# Patient Record
Sex: Female | Born: 1985 | Hispanic: No | Marital: Married | Smoking: Never smoker
Health system: Southern US, Community
[De-identification: ages and names within clinical notes are randomized; demographics above are authoritative.]

## PROBLEM LIST (undated history)

## (undated) DIAGNOSIS — O99280 Endocrine, nutritional and metabolic diseases complicating pregnancy, unspecified trimester: Secondary | ICD-10-CM

## (undated) DIAGNOSIS — R519 Headache, unspecified: Secondary | ICD-10-CM

## (undated) DIAGNOSIS — E079 Disorder of thyroid, unspecified: Secondary | ICD-10-CM

## (undated) DIAGNOSIS — E119 Type 2 diabetes mellitus without complications: Secondary | ICD-10-CM

## (undated) DIAGNOSIS — R51 Headache: Secondary | ICD-10-CM

## (undated) DIAGNOSIS — A048 Other specified bacterial intestinal infections: Secondary | ICD-10-CM

## (undated) DIAGNOSIS — Z8632 Personal history of gestational diabetes: Secondary | ICD-10-CM

## (undated) HISTORY — DX: Endocrine, nutritional and metabolic diseases complicating pregnancy, unspecified trimester: O99.280

## (undated) HISTORY — DX: Other specified bacterial intestinal infections: A04.8

## (undated) HISTORY — DX: Personal history of gestational diabetes: Z86.32

## (undated) HISTORY — DX: Disorder of thyroid, unspecified: E07.9

---

## 2016-03-22 NOTE — L&D Delivery Note (Addendum)
Patient is 31 y.o. W2N5621 [redacted]w[redacted]d admitted for SROM and active labor. Prenatal course also complicated by thyroid disease.  Delivery Note At 5:39 PM a viable female was delivered via  (Presentation: LOA ).  APGAR: 8 ,9 ; weight pending  .   Placenta status: intact, spontaneous  Cord: 3 vessel with the following complications: none  Anesthesia:  epidural Episiotomy:  none Lacerations:  none Est. Blood Loss (mL):  250  Mom to postpartum.  Baby to Couplet care / Skin to Skin.    Upon arrival, patient was complete. She pushed with good maternal effort to deliver a viable female infant in cephalic, LOA position over intact perineum. No nuchal cord present.Anterior shoulder delivered easily. Baby was noted to have good tone and place on maternal abdomen for oral suctioning, drying and stimulation. Delayed cord clamping performed. Placenta delivered spontaneously with gentle cord traction. Fundus firm with massage and Pitocin. Perineum inspected and found to have no lacerations. Counts of sharps, instruments, and lap pads were all correct.   Rolm Bookbinder, DO [place] Fellow

## 2016-05-31 LAB — OB RESULTS CONSOLE HEPATITIS B SURFACE ANTIGEN: HEP B S AG: NEGATIVE

## 2016-05-31 LAB — TSH
GLUCOSE 1 HOUR: 114
Hemoglobin A1C: 5.1
TSH: 0.4

## 2016-05-31 LAB — OB RESULTS CONSOLE HGB/HCT, BLOOD
HCT: 41
HEMOGLOBIN: 13.4

## 2016-05-31 LAB — OB RESULTS CONSOLE ANTIBODY SCREEN: Antibody Screen: NEGATIVE

## 2016-05-31 LAB — OB RESULTS CONSOLE RUBELLA ANTIBODY, IGM: Rubella: IMMUNE

## 2016-05-31 LAB — OB RESULTS CONSOLE ABO/RH: RH TYPE: POSITIVE

## 2016-05-31 LAB — OB RESULTS CONSOLE PLATELET COUNT: Platelets: 195

## 2016-05-31 LAB — OB RESULTS CONSOLE RPR: RPR: NONREACTIVE

## 2016-11-17 ENCOUNTER — Encounter: Payer: Self-pay | Admitting: *Deleted

## 2016-11-17 ENCOUNTER — Ambulatory Visit (INDEPENDENT_AMBULATORY_CARE_PROVIDER_SITE_OTHER): Payer: Medicaid Other | Admitting: Certified Nurse Midwife

## 2016-11-17 ENCOUNTER — Encounter: Payer: Self-pay | Admitting: Certified Nurse Midwife

## 2016-11-17 DIAGNOSIS — O09899 Supervision of other high risk pregnancies, unspecified trimester: Secondary | ICD-10-CM

## 2016-11-17 DIAGNOSIS — K279 Peptic ulcer, site unspecified, unspecified as acute or chronic, without hemorrhage or perforation: Secondary | ICD-10-CM

## 2016-11-17 DIAGNOSIS — Z8632 Personal history of gestational diabetes: Secondary | ICD-10-CM

## 2016-11-17 DIAGNOSIS — Z3480 Encounter for supervision of other normal pregnancy, unspecified trimester: Secondary | ICD-10-CM

## 2016-11-17 DIAGNOSIS — Z3483 Encounter for supervision of other normal pregnancy, third trimester: Secondary | ICD-10-CM | POA: Diagnosis not present

## 2016-11-17 DIAGNOSIS — R760 Raised antibody titer: Secondary | ICD-10-CM

## 2016-11-17 DIAGNOSIS — R768 Other specified abnormal immunological findings in serum: Secondary | ICD-10-CM

## 2016-11-17 DIAGNOSIS — R7689 Other specified abnormal immunological findings in serum: Secondary | ICD-10-CM | POA: Insufficient documentation

## 2016-11-17 LAB — POCT URINALYSIS DIP (DEVICE)
BILIRUBIN URINE: NEGATIVE
GLUCOSE, UA: NEGATIVE mg/dL
Hgb urine dipstick: NEGATIVE
KETONES UR: NEGATIVE mg/dL
Leukocytes, UA: NEGATIVE
Nitrite: NEGATIVE
PROTEIN: NEGATIVE mg/dL
SPECIFIC GRAVITY, URINE: 1.015 (ref 1.005–1.030)
Urobilinogen, UA: 0.2 mg/dL (ref 0.0–1.0)
pH: 7 (ref 5.0–8.0)

## 2016-11-17 MED ORDER — PREPLUS 27-1 MG PO TABS: 1.0000 | ORAL_TABLET | Freq: Every day | ORAL | 6 refills | Status: DC

## 2016-11-17 NOTE — Patient Instructions (Addendum)
Places to have your son circumcised:    [facility] [phone] $480 by 4 wks  [facility] [phone] $244 by 4 wks  [facility] [phone] $175 by 2 wks  [facility] [phone] $250 by 7 days [facility] [phone] $150 by 4 wks  These prices sometimes change but are roughly what you can expect to pay. Please call and confirm pricing.   Circumcision is considered an elective/non-medically necessary procedure. There are many reasons parents decide to have their sons circumsized. During the first year of life circumcised males have a reduced risk of urinary tract infections but after this year the rates between circumcised males and uncircumcised males are the same.  It is safe to have your son circumcised outside of the hospital and the places above perform them regularly.   AREA PEDIATRIC/FAMILY PRACTICE PHYSICIANS  [facility] [address] [place], [place] [address] Phone - [phone]   Fax - [phone]  [facility] [address] [place], [place]  [address] Phone - [phone]   Fax - [phone]  [facility] [address] [place], [place]  [address] Phone - [phone]   Fax - [phone]  [facility] [address] [place], [place]  [address] Phone - [phone]   Fax - [phone]  [facility] [address] [place], [place]  [address] Phone - [phone]   Fax - [phone]  [facility] [address], [address] [place], [place]  [address] Phone - [phone]   Fax - [phone]  [facility] [place] [address], [address] [place], [place]  [address] Phone - [phone]   Fax - [phone]  [facility] [address] [place], [place]  [address] Phone - [phone]   Fax - [phone]  [facility] [address] [place], [place]  [address] Phone - [phone]   Fax - [phone] [facility] [place] [address] [place], [place]  [address] Phone - [phone]   Fax - [phone]  [facility] [address] [place], [place]  [address] Phone - [phone]   Fax - [phone]  [facility] [address], [address] [place], [place]  [address] Phone - [phone]   Fax - [phone]  [facility] [place] [address], [address] [place], [place]  [address] Phone - [phone]   Fax - [phone]  [facility] [address], [address] [place], [place]  [address] Phone - [phone]  [facility] [address] [place], [place]  [address] Phone - [phone]   Fax - [phone]  [facility] [address], [address] [place], [place]  [address] Phone - [phone]   Fax - [phone]  [facility] [address] [place], [place]  [address] Phone - [phone]   Fax - [phone]  [facility] [address] [place], [place]  [address] Phone - [phone]   Fax - [phone]   [facility] [address], [address] [place], [place]  [address] Phone - [phone]   Fax - [phone]  [facility] [address], [address] [place], [place]  [address] Phone - [phone]   Fax - [phone]  [facility] RUBIN [address], [address] [place], [place]  [address] Phone - [phone]   Fax - [phone]  [facility] [address] [place], [place]  [address] Phone - [phone]   Fax - [phone]  [place] [facility] [address] [place]  [address] Phone - [phone]   Fax - [phone] [place] [facility] [address] [place], [place]  [address] Phone - [phone]   Fax - [phone]  [place] [facility] [address] [place], [place]  [address] Phone - [phone]   Fax - [phone]  [facility] [place] [address] [place], [place]  [address] Phone - [phone]   Fax - [phone]    

## 2016-11-17 NOTE — Progress Notes (Signed)
Here for first prenatal care. Moved from [place] where had started prenatal care and brought records with her. Given new patient education packets. States a week ago had scant darking vaginal discharge once- had intercourse . None since. Patient requesting prescription for prenatal vitamins without gelatin- preplus ordered and asked her to check with pharmacy to make sure does not have gelatin . Signed up for babyscripps app. Also expressed wants female providers. I explained to her she can ask for female providers when she schedules her appointments , but we can not guarantee that she will always see a female provider either in ob visits or delivery. She signed the consent re: involvement of female provider in pregnancy care. Medicaid home form completed

## 2016-11-17 NOTE — Progress Notes (Signed)
Subjective:  Melody ComasSumyya Hawbaker is a 31 y.o. G3P2002 at 2073w0d being seen today for her initial prenatal visit, [place] and [place] appt was 2 months ago.  She is currently monitored for the following issues for this high-risk pregnancy and has History of gestational diabetes; Supervision of other normal pregnancy, antepartum; Thyroid antibody positive; and Peptic ulcer disease on her problem list.  Patient reports no complaints.  Contractions: Not present. Vag. Bleeding: None, Other.  Movement: Present. Denies leaking of fluid.   The following portions of the patient's history were reviewed and updated as appropriate: allergies, current medications, past family history, past medical history, past social history, past surgical history and problem list. Problem list updated.  Objective:   Vitals:   11/17/16 1010 11/17/16 1012  BP: 98/61   Pulse: 98   Weight: 161 lb 8 oz (73.3 kg)   Height:  5\' 1"  (1.549 m)    Fetal Status: Fetal Heart Rate (bpm): 133 Fundal Height: 34 cm Movement: Present  Presentation: Vertex  General:  Alert, oriented and cooperative. Patient is in no acute distress.  Skin: Skin is warm and dry. No rash noted.   Cardiovascular: Normal heart rate noted  Respiratory: Normal respiratory effort, no problems with respiration noted  Abdomen: Soft, gravid, appropriate for gestational age. Pain/Pressure: Present     Pelvic: Vag. Bleeding: None, Other     Cervical exam deferred        Extremities: Normal range of motion.  Edema: Trace  Mental Status: Normal mood and affect. Normal behavior. Normal judgment and thought content.   Urinalysis: Urine Protein: Negative Urine Glucose: Negative  Assessment and Plan:  Pregnancy: G3P2002 at 7473w0d  1. Supervision of other normal pregnancy, antepartum - HIV antibody (with reflex) - Glucose Tolerance, 1 Hour - RPR - CBC  2. Thyroid antibody positive - TSH - Thyroid antibodies - T3, free - T4, free - US MFM OB COMP  + 14 WK; Future  3. Peptic ulcer disease - +h.pylori, prior to pregnancy, did not take meds d/t BF  4. History of gestational diabetes -last pregnancy, diet controlled - nml early GTT  Preterm labor symptoms and general obstetric precautions including but not limited to vaginal bleeding, contractions, leaking of fluid and fetal movement were reviewed in detail with the patient. Please refer to After Visit Summary for other counseling recommendations.  Return in about 2 weeks (around 12/01/2016).   Donette LarryBhambri, Todrick Siedschlag, CNM

## 2016-11-18 LAB — CBC
HEMOGLOBIN: 12.7 g/dL (ref 11.1–15.9)
Hematocrit: 37.7 % (ref 34.0–46.6)
MCH: 28.9 pg (ref 26.6–33.0)
MCHC: 33.7 g/dL (ref 31.5–35.7)
MCV: 86 fL (ref 79–97)
Platelets: 176 10*3/uL (ref 150–379)
RBC: 4.39 x10E6/uL (ref 3.77–5.28)
RDW: 14 % (ref 12.3–15.4)
WBC: 7.7 10*3/uL (ref 3.4–10.8)

## 2016-11-18 LAB — TSH: TSH: 0.727 u[IU]/mL (ref 0.450–4.500)

## 2016-11-18 LAB — T4, FREE: Free T4: 1.02 ng/dL (ref 0.82–1.77)

## 2016-11-18 LAB — THYROID ANTIBODIES: Thyroperoxidase Ab SerPl-aCnc: 12 IU/mL (ref 0–34)

## 2016-11-18 LAB — RPR: RPR: NONREACTIVE

## 2016-11-18 LAB — T3, FREE: T3, Free: 2.9 pg/mL (ref 2.0–4.4)

## 2016-11-18 LAB — HIV ANTIBODY (ROUTINE TESTING W REFLEX): HIV Screen 4th Generation wRfx: NONREACTIVE

## 2016-11-18 LAB — GLUCOSE TOLERANCE, 1 HOUR: GLUCOSE, 1HR PP: 146 mg/dL (ref 65–199)

## 2016-11-23 ENCOUNTER — Telehealth: Payer: Self-pay | Admitting: General Practice

## 2016-11-23 NOTE — Telephone Encounter (Signed)
-----   Message from Donette LarryMelanie Bhambri, [place] sent at 11/18/2016 11:04 AM EDT ----- Needs 2hr GTT

## 2016-11-23 NOTE — Telephone Encounter (Signed)
Called patient, no answer- left message to call us back concerning results & information regarding an appt

## 2016-11-24 ENCOUNTER — Encounter: Payer: Self-pay | Admitting: Obstetrics and Gynecology

## 2016-11-24 DIAGNOSIS — O99810 Abnormal glucose complicating pregnancy: Secondary | ICD-10-CM | POA: Insufficient documentation

## 2016-11-25 ENCOUNTER — Ambulatory Visit (HOSPITAL_COMMUNITY)
Admission: RE | Admit: 2016-11-25 | Discharge: 2016-11-25 | Disposition: A | Discharge status: Home / Self Care | Payer: Medicaid Other | Source: Ambulatory Visit | Attending: Certified Nurse Midwife | Admitting: Certified Nurse Midwife

## 2016-11-25 ENCOUNTER — Other Ambulatory Visit: Payer: Self-pay | Admitting: Certified Nurse Midwife

## 2016-11-25 ENCOUNTER — Encounter: Payer: Self-pay | Admitting: *Deleted

## 2016-11-25 DIAGNOSIS — O24419 Gestational diabetes mellitus in pregnancy, unspecified control: Secondary | ICD-10-CM | POA: Insufficient documentation

## 2016-11-25 DIAGNOSIS — R760 Raised antibody titer: Secondary | ICD-10-CM | POA: Insufficient documentation

## 2016-11-25 DIAGNOSIS — Z3A35 35 weeks gestation of pregnancy: Secondary | ICD-10-CM | POA: Diagnosis not present

## 2016-11-25 DIAGNOSIS — R768 Other specified abnormal immunological findings in serum: Secondary | ICD-10-CM

## 2016-11-25 DIAGNOSIS — Z3689 Encounter for other specified antenatal screening: Secondary | ICD-10-CM | POA: Insufficient documentation

## 2016-11-25 NOTE — Progress Notes (Signed)
Patient came to front desk window inquiring about her glucose tolerance test results. I informed her that her glucose was elevated and that she needed to return for a 2hr gtt. Patient has an appt on 9/13 and stated she will have the test at that time. I advised that she needs to have nothing to eat or drink after midnight the night before the test and also that she com in earlier than her 11:00 appt in order to get the test started and done by her appt time. Patient voiced understanding of instructions, will come in at 8am to start gtt.

## 2016-12-02 ENCOUNTER — Encounter: Payer: Self-pay | Admitting: *Deleted

## 2016-12-02 ENCOUNTER — Other Ambulatory Visit (HOSPITAL_COMMUNITY)
Admission: RE | Admit: 2016-12-02 | Discharge: 2016-12-02 | Disposition: A | Discharge status: Home / Self Care | Payer: Medicaid Other | Source: Ambulatory Visit | Attending: Obstetrics & Gynecology | Admitting: Obstetrics & Gynecology

## 2016-12-02 ENCOUNTER — Ambulatory Visit: Payer: Self-pay

## 2016-12-02 ENCOUNTER — Ambulatory Visit (INDEPENDENT_AMBULATORY_CARE_PROVIDER_SITE_OTHER): Payer: Medicaid Other | Admitting: Obstetrics & Gynecology

## 2016-12-02 VITALS — BP 98/59 | HR 90 | Wt 163.3 lb

## 2016-12-02 DIAGNOSIS — Z3A00 Weeks of gestation of pregnancy not specified: Secondary | ICD-10-CM | POA: Diagnosis not present

## 2016-12-02 DIAGNOSIS — O0993 Supervision of high risk pregnancy, unspecified, third trimester: Secondary | ICD-10-CM

## 2016-12-02 DIAGNOSIS — R760 Raised antibody titer: Secondary | ICD-10-CM | POA: Diagnosis not present

## 2016-12-02 DIAGNOSIS — Z3480 Encounter for supervision of other normal pregnancy, unspecified trimester: Secondary | ICD-10-CM

## 2016-12-02 DIAGNOSIS — R768 Other specified abnormal immunological findings in serum: Secondary | ICD-10-CM

## 2016-12-02 DIAGNOSIS — O0990 Supervision of high risk pregnancy, unspecified, unspecified trimester: Secondary | ICD-10-CM | POA: Diagnosis not present

## 2016-12-02 DIAGNOSIS — Z3689 Encounter for other specified antenatal screening: Secondary | ICD-10-CM

## 2016-12-02 DIAGNOSIS — Z23 Encounter for immunization: Secondary | ICD-10-CM | POA: Diagnosis not present

## 2016-12-02 DIAGNOSIS — Z8632 Personal history of gestational diabetes: Secondary | ICD-10-CM

## 2016-12-02 LAB — OB RESULTS CONSOLE GBS: STREP GROUP B AG: NEGATIVE

## 2016-12-02 MED ORDER — PREPLUS 27-1 MG PO TABS: 1.0000 | ORAL_TABLET | Freq: Every day | ORAL | 6 refills | Status: AC

## 2016-12-02 MED ORDER — PREPLUS 27-1 MG PO TABS: 1.0000 | ORAL_TABLET | Freq: Every day | ORAL | 6 refills | Status: DC

## 2016-12-02 NOTE — Progress Notes (Signed)
Opened in error

## 2016-12-02 NOTE — Progress Notes (Signed)
12/02/16 1:15 addendum: patient came to window, states she thought Dr. Who saw her today was sending PNV to her pharmacy but it wasn't there. I discussed with her no prenatal vitamins were sent but a rx was printed at her last visit. She states she lost the rx and would like one sent to her pharmacy. I verified her pharmacy and sent in rx.

## 2016-12-02 NOTE — Progress Notes (Signed)
Pt informed that the ultrasound is considered a limited OB ultrasound and is not intended to be a complete ultrasound exam.  Patient also informed that the ultrasound is not being completed with the intent of assessing for fetal or placental anomalies or any pelvic abnormalities.  Explained that the purpose of today's ultrasound is to assess for presentation.  Patient acknowledges the purpose of the exam and the limitations of the study.    Vertex presentation FHR - 135 bpm per M-mode Fetal breathing observed    PRENATAL VISIT NOTE  Subjective:  Sandra Arnold is a 31 y.o. G3P2002 at 5911w4d being seen today for ongoing prenatal care.  She is currently monitored for the following issues for this high-risk pregnancy and has History of gestational diabetes; Supervision of other high risk pregnancy, antepartum; Thyroid antibody positive; Peptic ulcer disease; and Abnormal glucose affecting pregnancy on her problem list.  Patient reports no complaints.  Contractions: Not present. Vag. Bleeding: None.  Movement: Present. Denies leaking of fluid.   The following portions of the patient's history were reviewed and updated as appropriate: allergies, current medications, past family history, past medical history, past social history, past surgical history and problem list. Problem list updated.  Objective:   Vitals:   12/02/16 1137  BP: (!) 98/59  Pulse: 90  Weight: 163 lb 4.8 oz (74.1 kg)    Fetal Status: Fetal Heart Rate (bpm): 144 Fundal Height: 36 cm Movement: Present  Presentation: Vertex  General:  Alert, oriented and cooperative. Patient is in no acute distress.  Skin: Skin is warm and dry. No rash noted.   Cardiovascular: Normal heart rate noted  Respiratory: Normal respiratory effort, no problems with respiration noted  Abdomen: Soft, gravid, appropriate for gestational age.  Pain/Pressure: Present     Pelvic: Cervical exam deferred      Pt was unable to relax enough to reach cervix.   Extremities: Normal range of motion.  Edema: Trace  Mental Status:  Normal mood and affect. Normal behavior. Normal judgment and thought content.   Assessment and Plan:  Pregnancy: G3P2002 at 7511w4d  1. Supervision of high risk pregnancy, antepartum - Pt understands a female can't be guaranteed at delviery or during all points of her care.   - Culture, beta strep (group b only) - Cervicovaginal ancillary only - Flu Vaccine QUAD 36+ mos IM - Tdap vaccine greater than or equal to 7yo IM - Glucose Tolerance, 2 Hours w/1 Hour  2. Determine fetal presentation using ultrasound -Vertex - US OB Limited; Future  3. Supervision of other normal pregnancy, antepartum - Prenatal Vit-Fe Fumarate-FA (PREPLUS) 27-1 MG TABS; Take 1 tablet by mouth daily.  Dispense: 30 tablet; Refill: 6  4. Gestational diabetes CBGs well controlled with diet Will need US for growth if undelivered by 12/23/16  Term labor symptoms and general obstetric precautions including but not limited to vaginal bleeding, contractions, leaking of fluid and fetal movement were reviewed in detail with the patient. Please refer to After Visit Summary for other counseling recommendations.   RTC 1 week   Elsie LincolnKelly Kathryn Linarez, MD

## 2016-12-03 ENCOUNTER — Telehealth: Payer: Self-pay

## 2016-12-03 LAB — GLUCOSE TOLERANCE, 2 HOURS W/ 1HR
GLUCOSE, 2 HOUR: 123 mg/dL (ref 65–152)
Glucose, 1 hour: 161 mg/dL (ref 65–179)
Glucose, Fasting: 81 mg/dL (ref 65–91)

## 2016-12-03 LAB — CERVICOVAGINAL ANCILLARY ONLY
Chlamydia: NEGATIVE
Neisseria Gonorrhea: NEGATIVE

## 2016-12-03 NOTE — Telephone Encounter (Signed)
Left message on pt's phone letting her know she passed her 2 hour GTT per Dr.Leggett's request

## 2016-12-06 LAB — CULTURE, BETA STREP (GROUP B ONLY): STREP GP B CULTURE: NEGATIVE

## 2016-12-13 ENCOUNTER — Ambulatory Visit (INDEPENDENT_AMBULATORY_CARE_PROVIDER_SITE_OTHER): Payer: Medicaid Other | Admitting: Certified Nurse Midwife

## 2016-12-13 VITALS — BP 108/66 | HR 95 | Wt 164.0 lb

## 2016-12-13 DIAGNOSIS — O26813 Pregnancy related exhaustion and fatigue, third trimester: Secondary | ICD-10-CM

## 2016-12-13 DIAGNOSIS — O09899 Supervision of other high risk pregnancies, unspecified trimester: Secondary | ICD-10-CM

## 2016-12-13 DIAGNOSIS — O99810 Abnormal glucose complicating pregnancy: Secondary | ICD-10-CM

## 2016-12-13 NOTE — Patient Instructions (Signed)
AREA PEDIATRIC/FAMILY PRACTICE PHYSICIANS  [facility] [address] [place], [place]  [address] Phone - [phone]   Fax - [phone]  [facility] [address] [place], [place]  [address] Phone - [phone]   Fax - [phone]  [facility] AT [facility] [address] [place] [place], [place]  [address] Phone - [phone]   Fax - [phone]  [facility] AT [facility] [address] [place], [place]  [address] Phone - [phone]   Fax - [phone] [facility] [place] [address] [place], [place]  [address] Phone - [phone]   Fax - [phone]  [facility] [address] [place], [place]  [address] Phone - [phone]   Fax - [phone]  [facility] [address] [place], [place]  [address] Phone - [phone]   Fax - [phone]  [facility] [place] [address], [address] [place], [place]  [address] Phone - [phone]   Fax - [phone]  [facility] [address] [place], [place]  [address] Phone - [phone]  [facility] [address] [place], [place]  [address] Phone - [phone]   Fax - [phone]  [facility] [address] [place], [place]  [address] Phone - [phone]   Fax - [phone]  [facility] [address] [place], [place]  [address] Phone - [phone]   Fax - [phone]  [facility] [address] [place], [place]  [address] Phone - [phone]   Fax - [phone]  [place] [facility] Alita Chyle [address] [place], [place]  [address] Phone - [phone]   Fax - [phone] Gerarda Fraction [address] [place], [place]  [address] Phone - [phone]   Fax - [phone]  [facility] [address] [place], [place]  [address] Phone - [phone]   Fax  - [phone]  [facility] - [place] [address] [place], [place]  [address] Phone - [phone]   Fax - [phone]

## 2016-12-13 NOTE — Progress Notes (Signed)
Subjective:  Sandra Arnold is a 31 y.o. G3P2002 at [redacted]w[redacted]d being seen today for ongoing prenatal care.  She is currently monitored for the following issues for this high-risk pregnancy and has History of gestational diabetes; Supervision of other high risk pregnancy, antepartum; Thyroid antibody positive; Peptic ulcer disease; and Abnormal glucose affecting pregnancy on her problem list.  Patient reports fatigue.  Contractions: Not present. Vag. Bleeding: None.  Movement: Present. Denies leaking of fluid.   Hx of low Vit D, wants to check today.  The following portions of the patient's history were reviewed and updated as appropriate: allergies, current medications, past family history, past medical history, past social history, past surgical history and problem list. Problem list updated.  Objective:   Vitals:   12/13/16 1256  BP: 108/66  Pulse: 95  Weight: 164 lb (74.4 kg)    Fetal Status: Fetal Heart Rate (bpm): 147   Movement: Present     General:  Alert, oriented and cooperative. Patient is in no acute distress.  Skin: Skin is warm and dry. No rash noted.   Cardiovascular: Normal heart rate noted  Respiratory: Normal respiratory effort, no problems with respiration noted  Abdomen: Soft, gravid, appropriate for gestational age. Pain/Pressure: Present     Pelvic: Vag. Bleeding: None     Cervical exam deferred        Extremities: Normal range of motion.  Edema: Trace  Mental Status: Normal mood and affect. Normal behavior. Normal judgment and thought content.   Urinalysis:      Assessment and Plan:  Pregnancy: G3P2002 at [redacted]w[redacted]d  1. Supervision of other high risk pregnancy, antepartum  2. Abnormal glucose affecting pregnancy - passed 2 hr  3. Fatigue during pregnancy in third trimester - Vitamin D (25 hydroxy)  Term labor symptoms and general obstetric precautions including but not limited to vaginal bleeding, contractions, leaking of fluid and fetal movement were reviewed  in detail with the patient. Please refer to After Visit Summary for other counseling recommendations.  Return in about 1 week (around 12/20/2016).   Donette Larry, CNM

## 2016-12-14 LAB — VITAMIN D 25 HYDROXY (VIT D DEFICIENCY, FRACTURES): Vit D, 25-Hydroxy: 33.1 ng/mL (ref 30.0–100.0)

## 2016-12-20 ENCOUNTER — Ambulatory Visit (INDEPENDENT_AMBULATORY_CARE_PROVIDER_SITE_OTHER): Payer: Medicaid Other | Admitting: Advanced Practice Midwife

## 2016-12-20 VITALS — BP 96/72 | HR 111 | Wt 164.8 lb

## 2016-12-20 DIAGNOSIS — O09899 Supervision of other high risk pregnancies, unspecified trimester: Secondary | ICD-10-CM

## 2016-12-20 DIAGNOSIS — O99810 Abnormal glucose complicating pregnancy: Secondary | ICD-10-CM

## 2016-12-20 DIAGNOSIS — O09893 Supervision of other high risk pregnancies, third trimester: Secondary | ICD-10-CM

## 2016-12-20 NOTE — Patient Instructions (Signed)
Third Trimester of Pregnancy The third trimester is from week 28 through week 40 (months 7 through 9). The third trimester is a time when the unborn baby (fetus) is growing rapidly. At the end of the ninth month, the fetus is about 20 inches in length and weighs 6-10 pounds. Body changes during your third trimester Your body will continue to go through many changes during pregnancy. The changes vary from woman to woman. During the third trimester:  Your weight will continue to increase. You can expect to gain 25-35 pounds (11-16 kg) by the end of the pregnancy.  You may begin to get stretch marks on your hips, abdomen, and breasts.  You may urinate more often because the fetus is moving lower into your pelvis and pressing on your bladder.  You may develop or continue to have heartburn. This is caused by increased hormones that slow down muscles in the digestive tract.  You may develop or continue to have constipation because increased hormones slow digestion and cause the muscles that push waste through your intestines to relax.  You may develop hemorrhoids. These are swollen veins (varicose veins) in the rectum that can itch or be painful.  You may develop swollen, bulging veins (varicose veins) in your legs.  You may have increased body aches in the pelvis, back, or thighs. This is due to weight gain and increased hormones that are relaxing your joints.  You may have changes in your hair. These can include thickening of your hair, rapid growth, and changes in texture. Some women also have hair loss during or after pregnancy, or hair that feels dry or thin. Your hair will most likely return to normal after your baby is born.  Your breasts will continue to grow and they will continue to become tender. A yellow fluid (colostrum) may leak from your breasts. This is the first milk you are producing for your baby.  Your belly button may stick out.  You may notice more swelling in your hands,  face, or ankles.  You may have increased tingling or numbness in your hands, arms, and legs. The skin on your belly may also feel numb.  You may feel short of breath because of your expanding uterus.  You may have more problems sleeping. This can be caused by the size of your belly, increased need to urinate, and an increase in your body's metabolism.  You may notice the fetus "dropping," or moving lower in your abdomen (lightening).  You may have increased vaginal discharge.  You may notice your joints feel loose and you may have pain around your pelvic bone.  What to expect at prenatal visits You will have prenatal exams every 2 weeks until week 36. Then you will have weekly prenatal exams. During a routine prenatal visit:  You will be weighed to make sure you and the baby are growing normally.  Your blood pressure will be taken.  Your abdomen will be measured to track your baby's growth.  The fetal heartbeat will be listened to.  Any test results from the previous visit will be discussed.  You may have a cervical check near your due date to see if your cervix has softened or thinned (effaced).  You will be tested for Group B streptococcus. This happens between 35 and 37 weeks.  Your health care provider may ask you:  What your birth plan is.  How you are feeling.  If you are feeling the baby move.  If you have had   any abnormal symptoms, such as leaking fluid, bleeding, severe headaches, or abdominal cramping.  If you are using any tobacco products, including cigarettes, chewing tobacco, and electronic cigarettes.  If you have any questions.  Other tests or screenings that may be performed during your third trimester include:  Blood tests that check for low iron levels (anemia).  Fetal testing to check the health, activity level, and growth of the fetus. Testing is done if you have certain medical conditions or if there are problems during the  pregnancy.  Nonstress test (NST). This test checks the health of your baby to make sure there are no signs of problems, such as the baby not getting enough oxygen. During this test, a belt is placed around your belly. The baby is made to move, and its heart rate is monitored during movement.  What is false labor? False labor is a condition in which you feel small, irregular tightenings of the muscles in the womb (contractions) that usually go away with rest, changing position, or drinking water. These are called Braxton Hicks contractions. Contractions may last for hours, days, or even weeks before true labor sets in. If contractions come at regular intervals, become more frequent, increase in intensity, or become painful, you should see your health care provider. What are the signs of labor?  Abdominal cramps.  Regular contractions that start at 10 minutes apart and become stronger and more frequent with time.  Contractions that start on the top of the uterus and spread down to the lower abdomen and back.  Increased pelvic pressure and dull back pain.  A watery or bloody mucus discharge that comes from the vagina.  Leaking of amniotic fluid. This is also known as your "water breaking." It could be a slow trickle or a gush. Let your health care provider know if it has a color or strange odor. If you have any of these signs, call your health care provider right away, even if it is before your due date. Follow these instructions at home: Medicines  Follow your health care provider's instructions regarding medicine use. Specific medicines may be either safe or unsafe to take during pregnancy.  Take a prenatal vitamin that contains at least 600 micrograms (mcg) of folic acid.  If you develop constipation, try taking a stool softener if your health care provider approves. Eating and drinking  Eat a balanced diet that includes fresh fruits and vegetables, whole grains, good sources of protein  such as meat, eggs, or tofu, and low-fat dairy. Your health care provider will help you determine the amount of weight gain that is right for you.  Avoid raw meat and uncooked cheese. These carry germs that can cause birth defects in the baby.  If you have low calcium intake from food, talk to your health care provider about whether you should take a daily calcium supplement.  Eat four or five small meals rather than three large meals a day.  Limit foods that are high in fat and processed sugars, such as fried and sweet foods.  To prevent constipation: ? Drink enough fluid to keep your urine clear or pale yellow. ? Eat foods that are high in fiber, such as fresh fruits and vegetables, whole grains, and beans. Activity  Exercise only as directed by your health care provider. Most women can continue their usual exercise routine during pregnancy. Try to exercise for 30 minutes at least 5 days a week. Stop exercising if you experience uterine contractions.  Avoid heavy   lifting.  Do not exercise in extreme heat or humidity, or at high altitudes.  Wear low-heel, comfortable shoes.  Practice good posture.  You may continue to have sex unless your health care provider tells you otherwise. Relieving pain and discomfort  Take frequent breaks and rest with your legs elevated if you have leg cramps or low back pain.  Take warm sitz baths to soothe any pain or discomfort caused by hemorrhoids. Use hemorrhoid cream if your health care provider approves.  Wear a good support bra to prevent discomfort from breast tenderness.  If you develop varicose veins: ? Wear support pantyhose or compression stockings as told by your healthcare provider. ? Elevate your feet for 15 minutes, 3-4 times a day. Prenatal care  Write down your questions. Take them to your prenatal visits.  Keep all your prenatal visits as told by your health care provider. This is important. Safety  Wear your seat belt at  all times when driving.  Make a list of emergency phone numbers, including numbers for family, friends, the hospital, and police and fire departments. General instructions  Avoid cat litter boxes and soil used by cats. These carry germs that can cause birth defects in the baby. If you have a cat, ask someone to clean the litter box for you.  Do not travel far distances unless it is absolutely necessary and only with the approval of your health care provider.  Do not use hot tubs, steam rooms, or saunas.  Do not drink alcohol.  Do not use any products that contain nicotine or tobacco, such as cigarettes and e-cigarettes. If you need help quitting, ask your health care provider.  Do not use any medicinal herbs or unprescribed drugs. These chemicals affect the formation and growth of the baby.  Do not douche or use tampons or scented sanitary pads.  Do not cross your legs for long periods of time.  To prepare for the arrival of your baby: ? Take prenatal classes to understand, practice, and ask questions about labor and delivery. ? Make a trial run to the hospital. ? Visit the hospital and tour the maternity area. ? Arrange for maternity or paternity leave through employers. ? Arrange for family and friends to take care of pets while you are in the hospital. ? Purchase a rear-facing car seat and make sure you know how to install it in your car. ? Pack your hospital bag. ? Prepare the baby's nursery. Make sure to remove all pillows and stuffed animals from the baby's crib to prevent suffocation.  Visit your dentist if you have not gone during your pregnancy. Use a soft toothbrush to brush your teeth and be gentle when you floss. Contact a health care provider if:  You are unsure if you are in labor or if your water has broken.  You become dizzy.  You have mild pelvic cramps, pelvic pressure, or nagging pain in your abdominal area.  You have lower back pain.  You have persistent  nausea, vomiting, or diarrhea.  You have an unusual or bad smelling vaginal discharge.  You have pain when you urinate. Get help right away if:  Your water breaks before 37 weeks.  You have regular contractions less than 5 minutes apart before 37 weeks.  You have a fever.  You are leaking fluid from your vagina.  You have spotting or bleeding from your vagina.  You have severe abdominal pain or cramping.  You have rapid weight loss or weight gain.    You have shortness of breath with chest pain.  You notice sudden or extreme swelling of your face, hands, ankles, feet, or legs.  Your baby makes fewer than 10 movements in 2 hours.  You have severe headaches that do not go away when you take medicine.  You have vision changes. Summary  The third trimester is from week 28 through week 40, months 7 through 9. The third trimester is a time when the unborn baby (fetus) is growing rapidly.  During the third trimester, your discomfort may increase as you and your baby continue to gain weight. You may have abdominal, leg, and back pain, sleeping problems, and an increased need to urinate.  During the third trimester your breasts will keep growing and they will continue to become tender. A yellow fluid (colostrum) may leak from your breasts. This is the first milk you are producing for your baby.  False labor is a condition in which you feel small, irregular tightenings of the muscles in the womb (contractions) that eventually go away. These are called Braxton Hicks contractions. Contractions may last for hours, days, or even weeks before true labor sets in.  Signs of labor can include: abdominal cramps; regular contractions that start at 10 minutes apart and become stronger and more frequent with time; watery or bloody mucus discharge that comes from the vagina; increased pelvic pressure and dull back pain; and leaking of amniotic fluid. This information is not intended to replace advice  given to you by your health care provider. Make sure you discuss any questions you have with your health care provider. Document Released: 03/02/2001 Document Revised: 08/14/2015 Document Reviewed: 05/09/2012 Elsevier Interactive Patient Education  2017 [employer]  

## 2016-12-20 NOTE — Progress Notes (Signed)
   PRENATAL VISIT NOTE  Subjective:  Sandra Arnold is a 31 y.o. G3P2002 at [redacted]w[redacted]d being seen today for ongoing prenatal care.  She is currently monitored for the following issues for this low-risk pregnancy and has History of gestational diabetes; Supervision of other high risk pregnancy, antepartum; Thyroid antibody positive; Peptic ulcer disease; and Abnormal glucose affecting pregnancy on her problem list.  Patient reports no complaints.  Contractions: Not present. Vag. Bleeding: None.  Movement: Present. Denies leaking of fluid.   The following portions of the patient's history were reviewed and updated as appropriate: allergies, current medications, past family history, past medical history, past social history, past surgical history and problem list. Problem list updated.  Objective:   Vitals:   12/20/16 1100  BP: 96/72  Pulse: (!) 111  Weight: 164 lb 12.8 oz (74.8 kg)    Fetal Status:     Movement: Present     General:  Alert, oriented and cooperative. Patient is in no acute distress.  Skin: Skin is warm and dry. No rash noted.   Cardiovascular: Normal heart rate noted  Respiratory: Normal respiratory effort, no problems with respiration noted  Abdomen: Soft, gravid, appropriate for gestational age.  Pain/Pressure: Present     Pelvic: Cervical exam deferred        Extremities: Normal range of motion.  Edema: Trace  Mental Status:  Normal mood and affect. Normal behavior. Normal judgment and thought content.   Assessment and Plan:  Pregnancy: G3P2002 at [redacted]w[redacted]d  1. Supervision of other high risk pregnancy, antepartum - Vit D3: 33.1 reviewed results with patient.  - Patient reports that she had normal pap in [place] will get records.   2. Abnormal glucose affecting pregnancy -Patient had normal 2 hour GTT on 9/13.  -No GDM dx, not checking blood sugars.   Term labor symptoms and general obstetric precautions including but not limited to vaginal bleeding, contractions, leaking  of fluid and fetal movement were reviewed in detail with the patient. Please refer to After Visit Summary for other counseling recommendations.  Return in about 1 week (around 12/27/2016).   Thressa Sheller, CNM

## 2016-12-23 ENCOUNTER — Inpatient Hospital Stay (HOSPITAL_COMMUNITY): Payer: Medicaid Other | Admitting: Anesthesiology

## 2016-12-23 ENCOUNTER — Encounter (HOSPITAL_COMMUNITY): Payer: Self-pay | Admitting: *Deleted

## 2016-12-23 ENCOUNTER — Inpatient Hospital Stay (HOSPITAL_COMMUNITY)
Admission: AD | Admit: 2016-12-23 | Discharge: 2016-12-25 | DRG: 805 | Disposition: A | Discharge status: Home / Self Care | Payer: Medicaid Other | Source: Ambulatory Visit | Attending: Obstetrics and Gynecology | Admitting: Obstetrics and Gynecology

## 2016-12-23 DIAGNOSIS — O99284 Endocrine, nutritional and metabolic diseases complicating childbirth: Principal | ICD-10-CM | POA: Diagnosis present

## 2016-12-23 DIAGNOSIS — R768 Other specified abnormal immunological findings in serum: Secondary | ICD-10-CM

## 2016-12-23 DIAGNOSIS — O99810 Abnormal glucose complicating pregnancy: Secondary | ICD-10-CM

## 2016-12-23 DIAGNOSIS — O09899 Supervision of other high risk pregnancies, unspecified trimester: Secondary | ICD-10-CM

## 2016-12-23 DIAGNOSIS — Z3A39 39 weeks gestation of pregnancy: Secondary | ICD-10-CM

## 2016-12-23 DIAGNOSIS — E079 Disorder of thyroid, unspecified: Secondary | ICD-10-CM | POA: Diagnosis present

## 2016-12-23 DIAGNOSIS — O26893 Other specified pregnancy related conditions, third trimester: Secondary | ICD-10-CM | POA: Diagnosis present

## 2016-12-23 DIAGNOSIS — O411230 Chorioamnionitis, third trimester, not applicable or unspecified: Secondary | ICD-10-CM | POA: Diagnosis present

## 2016-12-23 DIAGNOSIS — Z8632 Personal history of gestational diabetes: Secondary | ICD-10-CM

## 2016-12-23 DIAGNOSIS — K279 Peptic ulcer, site unspecified, unspecified as acute or chronic, without hemorrhage or perforation: Secondary | ICD-10-CM

## 2016-12-23 HISTORY — DX: Headache, unspecified: R51.9

## 2016-12-23 HISTORY — DX: Headache: R51

## 2016-12-23 HISTORY — DX: Type 2 diabetes mellitus without complications: E11.9

## 2016-12-23 LAB — TYPE AND SCREEN
ABO/RH(D): AB POS
Antibody Screen: NEGATIVE

## 2016-12-23 LAB — CBC
HCT: 40.5 % (ref 36.0–46.0)
HEMATOCRIT: 41.4 % (ref 36.0–46.0)
HEMOGLOBIN: 14 g/dL (ref 12.0–15.0)
HEMOGLOBIN: 13.8 g/dL (ref 12.0–15.0)
MCH: 29.7 pg (ref 26.0–34.0)
MCH: 29.8 pg (ref 26.0–34.0)
MCHC: 33.8 g/dL (ref 30.0–36.0)
MCHC: 34.1 g/dL (ref 30.0–36.0)
MCV: 87.7 fL (ref 78.0–100.0)
MCV: 87.5 fL (ref 78.0–100.0)
PLATELETS: 129 10*3/uL — AB (ref 150–400)
Platelets: 154 10*3/uL (ref 150–400)
RBC: 4.72 MIL/uL (ref 3.87–5.11)
RBC: 4.63 MIL/uL (ref 3.87–5.11)
RDW: 14.6 % (ref 11.5–15.5)
RDW: 14.7 % (ref 11.5–15.5)
WBC: 8.6 10*3/uL (ref 4.0–10.5)
WBC: 16.8 10*3/uL — ABNORMAL HIGH (ref 4.0–10.5)

## 2016-12-23 LAB — POCT FERN TEST: POCT Fern Test: POSITIVE

## 2016-12-23 LAB — ABO/RH: ABO/RH(D): AB POS

## 2016-12-23 LAB — RPR: RPR: NONREACTIVE

## 2016-12-23 MED ORDER — ONDANSETRON HCL 4 MG PO TABS
4.0000 mg | ORAL_TABLET | ORAL | Status: DC | PRN
Start: 2016-12-23 — End: 2016-12-25

## 2016-12-23 MED ORDER — OXYTOCIN 40 UNITS IN LACTATED RINGERS INFUSION - SIMPLE MED
2.5000 [IU]/h | INTRAVENOUS | Status: DC
  Filled 2016-12-23: qty 1000

## 2016-12-23 MED ORDER — PRENATAL MULTIVITAMIN CH
1.0000 | ORAL_TABLET | Freq: Every day | ORAL | Status: DC
  Administered 2016-12-24 – 2016-12-25 (×2): 1 via ORAL
  Filled 2016-12-23 (×2): qty 1

## 2016-12-23 MED ORDER — EPHEDRINE 5 MG/ML INJ: 10.0000 mg | INTRAVENOUS | Status: DC | PRN

## 2016-12-23 MED ORDER — PIPERACILLIN-TAZOBACTAM 3.375 G IVPB
3.3750 g | Freq: Three times a day (TID) | INTRAVENOUS | Status: DC
  Administered 2016-12-23: 3.375 g via INTRAVENOUS
  Filled 2016-12-23: qty 50

## 2016-12-23 MED ORDER — DIPHENHYDRAMINE HCL 25 MG PO CAPS: 25.0000 mg | ORAL_CAPSULE | Freq: Four times a day (QID) | ORAL | Status: DC | PRN

## 2016-12-23 MED ORDER — TERBUTALINE SULFATE 1 MG/ML IJ SOLN
0.2500 mg | Freq: Once | INTRAMUSCULAR | Status: DC | PRN
  Filled 2016-12-23: qty 1

## 2016-12-23 MED ORDER — WITCH HAZEL-GLYCERIN EX PADS: 1.0000 "application " | MEDICATED_PAD | CUTANEOUS | Status: DC | PRN

## 2016-12-23 MED ORDER — OXYTOCIN BOLUS FROM INFUSION: 500.0000 mL | Freq: Once | INTRAVENOUS | Status: DC

## 2016-12-23 MED ORDER — COCONUT OIL OIL
1.0000 "application " | TOPICAL_OIL | Status: DC | PRN
  Administered 2016-12-24: 1 via TOPICAL
  Filled 2016-12-23: qty 120

## 2016-12-23 MED ORDER — SOD CITRATE-CITRIC ACID 500-334 MG/5ML PO SOLN: 30.0000 mL | ORAL | Status: DC | PRN

## 2016-12-23 MED ORDER — EPHEDRINE 5 MG/ML INJ
10.0000 mg | INTRAVENOUS | Status: DC | PRN
Start: 2016-12-23 — End: 2016-12-23
  Filled 2016-12-23: qty 2
  Filled 2016-12-23: qty 4

## 2016-12-23 MED ORDER — PHENYLEPHRINE 40 MCG/ML (10ML) SYRINGE FOR IV PUSH (FOR BLOOD PRESSURE SUPPORT)
80.0000 ug | PREFILLED_SYRINGE | INTRAVENOUS | Status: DC | PRN
  Filled 2016-12-23: qty 5
  Filled 2016-12-23: qty 10

## 2016-12-23 MED ORDER — ONDANSETRON HCL 4 MG/2ML IJ SOLN: 4.0000 mg | Freq: Four times a day (QID) | INTRAMUSCULAR | Status: DC | PRN

## 2016-12-23 MED ORDER — SENNOSIDES-DOCUSATE SODIUM 8.6-50 MG PO TABS
2.0000 | ORAL_TABLET | ORAL | Status: DC
  Administered 2016-12-23 – 2016-12-25 (×2): 2 via ORAL
  Filled 2016-12-23 (×2): qty 2

## 2016-12-23 MED ORDER — LIDOCAINE HCL (PF) 1 % IJ SOLN
INTRAMUSCULAR | Status: DC | PRN
  Administered 2016-12-23: 4 mL via EPIDURAL

## 2016-12-23 MED ORDER — OXYCODONE-ACETAMINOPHEN 5-325 MG PO TABS: 2.0000 | ORAL_TABLET | ORAL | Status: DC | PRN

## 2016-12-23 MED ORDER — ZOLPIDEM TARTRATE 5 MG PO TABS: 5.0000 mg | ORAL_TABLET | Freq: Every evening | ORAL | Status: DC | PRN

## 2016-12-23 MED ORDER — IBUPROFEN 600 MG PO TABS
600.0000 mg | ORAL_TABLET | Freq: Four times a day (QID) | ORAL | Status: DC
  Administered 2016-12-23 – 2016-12-25 (×8): 600 mg via ORAL
  Filled 2016-12-23 (×8): qty 1

## 2016-12-23 MED ORDER — LACTATED RINGERS IV SOLN
500.0000 mL | INTRAVENOUS | Status: DC | PRN
  Administered 2016-12-23: 1000 mL via INTRAVENOUS

## 2016-12-23 MED ORDER — BENZOCAINE-MENTHOL 20-0.5 % EX AERO
1.0000 "application " | INHALATION_SPRAY | CUTANEOUS | Status: DC | PRN
  Administered 2016-12-24: 1 via TOPICAL
  Filled 2016-12-23: qty 56

## 2016-12-23 MED ORDER — EPHEDRINE 5 MG/ML INJ
10.0000 mg | INTRAVENOUS | Status: DC | PRN
  Administered 2016-12-23: 10 mg via INTRAVENOUS
  Filled 2016-12-23: qty 2

## 2016-12-23 MED ORDER — TETANUS-DIPHTH-ACELL PERTUSSIS 5-2.5-18.5 LF-MCG/0.5 IM SUSP: 0.5000 mL | Freq: Once | INTRAMUSCULAR | Status: DC

## 2016-12-23 MED ORDER — LIDOCAINE HCL (PF) 1 % IJ SOLN
30.0000 mL | INTRAMUSCULAR | Status: DC | PRN
  Filled 2016-12-23: qty 30

## 2016-12-23 MED ORDER — PHENYLEPHRINE 40 MCG/ML (10ML) SYRINGE FOR IV PUSH (FOR BLOOD PRESSURE SUPPORT): 80.0000 ug | PREFILLED_SYRINGE | INTRAVENOUS | Status: DC | PRN

## 2016-12-23 MED ORDER — LACTATED RINGERS IV SOLN
500.0000 mL | Freq: Once | INTRAVENOUS | Status: AC
  Administered 2016-12-23: 1000 mL via INTRAVENOUS

## 2016-12-23 MED ORDER — ACETAMINOPHEN 325 MG PO TABS
650.0000 mg | ORAL_TABLET | ORAL | Status: DC | PRN
  Administered 2016-12-24 – 2016-12-25 (×3): 650 mg via ORAL
  Filled 2016-12-23 (×3): qty 2

## 2016-12-23 MED ORDER — ACETAMINOPHEN 325 MG PO TABS: 650.0000 mg | ORAL_TABLET | ORAL | Status: DC | PRN

## 2016-12-23 MED ORDER — LACTATED RINGERS IV SOLN: 500.0000 mL | INTRAVENOUS | Status: DC | PRN

## 2016-12-23 MED ORDER — DIBUCAINE 1 % RE OINT: 1.0000 "application " | TOPICAL_OINTMENT | RECTAL | Status: DC | PRN

## 2016-12-23 MED ORDER — FENTANYL 2.5 MCG/ML BUPIVACAINE 1/10 % EPIDURAL INFUSION (WH - ANES)
14.0000 mL/h | INTRAMUSCULAR | Status: DC | PRN
  Administered 2016-12-23: 14 mL/h via EPIDURAL
  Filled 2016-12-23: qty 100

## 2016-12-23 MED ORDER — PHENYLEPHRINE 40 MCG/ML (10ML) SYRINGE FOR IV PUSH (FOR BLOOD PRESSURE SUPPORT)
80.0000 ug | PREFILLED_SYRINGE | INTRAVENOUS | Status: DC | PRN
  Administered 2016-12-23: 80 ug via INTRAVENOUS
  Filled 2016-12-23: qty 5

## 2016-12-23 MED ORDER — LACTATED RINGERS IV SOLN
INTRAVENOUS | Status: DC
  Administered 2016-12-23 (×2): via INTRAVENOUS

## 2016-12-23 MED ORDER — ONDANSETRON HCL 4 MG/2ML IJ SOLN: 4.0000 mg | INTRAMUSCULAR | Status: DC | PRN

## 2016-12-23 MED ORDER — OXYCODONE-ACETAMINOPHEN 5-325 MG PO TABS: 1.0000 | ORAL_TABLET | ORAL | Status: DC | PRN

## 2016-12-23 MED ORDER — SIMETHICONE 80 MG PO CHEW: 80.0000 mg | CHEWABLE_TABLET | ORAL | Status: DC | PRN

## 2016-12-23 MED ORDER — FLEET ENEMA 7-19 GM/118ML RE ENEM: 1.0000 | ENEMA | RECTAL | Status: DC | PRN

## 2016-12-23 MED ORDER — LACTATED RINGERS IV SOLN: 500.0000 mL | Freq: Once | INTRAVENOUS | Status: DC

## 2016-12-23 MED ORDER — FENTANYL CITRATE (PF) 100 MCG/2ML IJ SOLN: 100.0000 ug | INTRAMUSCULAR | Status: DC | PRN

## 2016-12-23 MED ORDER — LACTATED RINGERS IV SOLN
INTRAVENOUS | Status: DC
  Administered 2016-12-23: 09:00:00 via INTRAVENOUS

## 2016-12-23 MED ORDER — DIPHENHYDRAMINE HCL 50 MG/ML IJ SOLN: 12.5000 mg | INTRAMUSCULAR | Status: DC | PRN

## 2016-12-23 MED ORDER — OXYTOCIN 40 UNITS IN LACTATED RINGERS INFUSION - SIMPLE MED
1.0000 m[IU]/min | INTRAVENOUS | Status: DC
  Administered 2016-12-23: 2 m[IU]/min via INTRAVENOUS

## 2016-12-23 NOTE — Anesthesia Procedure Notes (Signed)
Epidural Patient location during procedure: OB Start time: 12/23/2016 10:50 AM End time: 12/23/2016 10:56 AM  Staffing Anesthesiologist: Shona Simpson D Performed: anesthesiologist   Preanesthetic Checklist Completed: patient identified, site marked, surgical consent, pre-op evaluation, timeout performed, IV checked, risks and benefits discussed and monitors and equipment checked  Epidural Patient position: sitting Prep: ChloraPrep Patient monitoring: heart rate, continuous pulse ox and blood pressure Approach: midline Location: L3-L4 Injection technique: LOR saline  Needle:  Needle type: Tuohy  Needle gauge: 17 G Needle length: 9 cm Catheter type: closed end flexible Catheter size: 20 Guage Test dose: negative and 1.5% lidocaine  Assessment Events: blood not aspirated, injection not painful, no injection resistance and no paresthesia  Additional Notes LOR @ 5  Patient identified. Risks/Benefits/Options discussed with patient including but not limited to bleeding, infection, nerve damage, paralysis, failed block, incomplete pain control, headache, blood pressure changes, nausea, vomiting, reactions to medications, itching and postpartum back pain. Confirmed with bedside nurse the patient's most recent platelet count. Confirmed with patient that they are not currently taking any anticoagulation, have any bleeding history or any family history of bleeding disorders. Patient expressed understanding and wished to proceed. All questions were answered. Sterile technique was used throughout the entire procedure. Please see nursing notes for vital signs. Test dose was given through epidural catheter and negative prior to continuing to dose epidural or start infusion. Warning signs of high block given to the patient including shortness of breath, tingling/numbness in hands, complete motor block, or any concerning symptoms with instructions to call for help. Patient was given instructions on  fall risk and not to get out of bed. All questions and concerns addressed with instructions to call with any issues or inadequate analgesia.    Reason for block:procedure for pain

## 2016-12-23 NOTE — MAU Note (Signed)
C/o LOF clear with yellow tinge since 2am +contractions  Patient reports every 20 minutes; with pain of 7/10 Denies any recent VE

## 2016-12-23 NOTE — Anesthesia Preprocedure Evaluation (Signed)
Anesthesia Evaluation  Patient identified by MRN, date of birth, ID band Patient awake    Reviewed: Allergy & Precautions, Patient's Chart, lab work & pertinent test results  Airway Mallampati: II       Dental   Pulmonary neg pulmonary ROS,    Pulmonary exam normal        Cardiovascular negative cardio ROS Normal cardiovascular exam Rhythm:Regular     Neuro/Psych  Headaches,    GI/Hepatic Neg liver ROS, PUD,   Endo/Other  negative endocrine ROSdiabetes  Renal/GU negative Renal ROS     Musculoskeletal negative musculoskeletal ROS (+)   Abdominal   Peds  Hematology negative hematology ROS (+)   Anesthesia Other Findings Day of surgery medications reviewed with the patient.  Reproductive/Obstetrics (+) Pregnancy                             Lab Results  Component Value Date   WBC 8.6 12/23/2016   HGB 14.0 12/23/2016   HCT 41.4 12/23/2016   MCV 87.7 12/23/2016   PLT 154 12/23/2016   No results found for: INR, PROTIME   Anesthesia Physical Anesthesia Plan  ASA: II  Anesthesia Plan: Epidural   Post-op Pain Management:    Induction:   PONV Risk Score and Plan:   Airway Management Planned:   Additional Equipment:   Intra-op Plan:   Post-operative Plan:   Informed Consent: I have reviewed the patients History and Physical, chart, labs and discussed the procedure including the risks, benefits and alternatives for the proposed anesthesia with the patient or authorized representative who has indicated his/her understanding and acceptance.     Plan Discussed with:   Anesthesia Plan Comments:         Anesthesia Quick Evaluation

## 2016-12-23 NOTE — Anesthesia Pain Management Evaluation Note (Signed)
  CRNA Pain Management Visit Note  Patient: Sandra Arnold, 31 y.o., female  "Hello I am a member of the anesthesia team at [facility]. We have an anesthesia team available at all times to provide care throughout the hospital, including epidural management and anesthesia for C-section. I don't know your plan for the delivery whether it a natural birth, water birth, IV sedation, nitrous supplementation, doula or epidural, but we want to meet your pain goals."   1.Was your pain managed to your expectations on prior hospitalizations?   Yes   2.What is your expectation for pain management during this hospitalization?     Epidural  3.How can we help you reach that goal? Epidural when ready  Record the patient's initial score and the patient's pain goal.   Pain: 7  Pain Goal: 7 [facility] wants you to be able to say your pain was always managed very well.  Edison Pace 12/23/2016

## 2016-12-23 NOTE — H&P (Signed)
LABOR AND DELIVERY ADMISSION HISTORY AND PHYSICAL NOTE  Sandra Arnold is a 31 y.o. female G68P2002 with IUP at [redacted]w[redacted]d by LMP presenting for spontaneous rupture of membranes.  She reports positive fetal movement. She had leakage of fluid but denies vaginal bleeding. Her pain is currently a 7/10 and is described as crampy abdominal pain. She states it feels like contractions. She has no other complaints at this time.  She denies headache, chest pain, palpitations, SOB, difficulty breathing, pain or burning on urination, constipation.  Prenatal History/Complications: [facility] Pregnancy complications:  - None this pregnancy  Past Medical History: Past Medical History:  Diagnosis Date  . H. pylori infection   . History of gestational diabetes   . Thyroid disease affecting pregnancy     Past Surgical History: History reviewed. No pertinent surgical history.  Obstetrical History: OB History    Gravida Para Term Preterm AB Living   0 0 2   SAB TAB Ectopic Multiple Live Births   0 0 0 0 2      Social History: Social History   Social History  . Marital status: Married    Spouse name: N/A  . Number of children: N/A  . Years of education: N/A   Social History Main Topics  . Smoking status: Never Smoker  . Smokeless tobacco: Never Used  . Alcohol use No  . Drug use: No  . Sexual activity: Yes    Birth control/ protection: None   Other Topics Concern  . Not on file   Social History Narrative  . No narrative on file    Family History: Family History  Problem Relation Age of Onset  . Hypertension Mother   . Diabetes Mother     Allergies: No Known Allergies  Prescriptions Prior to Admission  Medication Sig Dispense Refill Last Dose  . Prenatal Multivit-Min-Fe-FA (PRENATAL VITAMINS PO) Take 1 tablet by mouth daily.   Not Taking  . Prenatal Vit-Fe Fumarate-FA (PREPLUS) 27-1 MG TABS Take 1 tablet by mouth daily. 30 tablet 6 Taking      Review of Systems  All systems reviewed and negative except as stated in HPI  Physical Exam Blood pressure 104/67, pulse 92, temperature 98.3 F (36.8 C), temperature source Oral, resp. rate 18, last menstrual period 03/24/2016. General appearance: alert, cooperative and no distress Lungs: clear to auscultation bilaterally Heart: regular rate and rhythm Abdomen: soft, non-tender; bowel sounds normal Extremities: No calf swelling or tenderness Presentation: declined exam Fetal monitoring: 135bpm, moderate variability, accelerations presents, few decelerations present but not sustained Uterine activity: regular contractions every 3-5 minutes Dilation:  (attempted; patient not tolerating exam; awaiting bedside u/s for presentation)  Prenatal labs: ABO, Rh: AB/Positive/-- (03/12 0000) Antibody: Negative (03/12 0000) Rubella: Immune (03/12 0000) RPR: Non Reactive (08/29 1315)  HBsAg: Negative (03/12 0000)  HIV:   neg GC/Chlamydia: neg @ 9/13 GBS:   neg 1 hr Glucola: 146; 2 hr GTT follow up normal Genetic screening:  Not on file Anatomy US: normal female, anterior placenta  Prenatal Transfer Tool  Maternal Diabetes: No Genetic Screening: not on file Maternal Ultrasounds/Referrals: Normal Fetal Ultrasounds or other Referrals:  Fetal echo Maternal Substance Abuse:  No Significant Maternal Medications:  None Significant Maternal Lab Results: None  Results for orders placed or performed during the hospital encounter of 12/23/16 (from the past 24 hour(s))  POCT fern test   Collection Time: 12/23/16  8:10 AM  Result Value Ref Range  POCT Fern Test Positive = ruptured amniotic membanes   CBC   Collection Time: 12/23/16  8:53 AM  Result Value Ref Range   WBC 8.6 4.0 - 10.5 K/uL   RBC 4.72 3.87 - 5.11 MIL/uL   Hemoglobin 14.0 12.0 - 15.0 g/dL   HCT 16.1 09.6 - 04.5 %   MCV 87.7 78.0 - 100.0 fL   MCH 29.7 26.0 - 34.0 pg   MCHC 33.8 30.0 - 36.0 g/dL   RDW 40.9 81.1 - 91.4 %    Platelets 154 150 - 400 K/uL    Patient Active Problem List   Diagnosis Date Noted  . Indication for care in labor or delivery 12/23/2016  . Normal labor 12/23/2016  . Abnormal glucose affecting pregnancy 11/24/2016  . Supervision of other high risk pregnancy, antepartum 11/17/2016  . Thyroid antibody positive 11/17/2016  . Peptic ulcer disease 11/17/2016  . History of gestational diabetes     Assessment: Sandra Arnold is a 31 y.o. G3P2002 at [redacted]w[redacted]d here for spontaneous rupture of membranes and regular contractions that are increasing in frequency. A cervical exam was attempted but too painful for patient.  #Labor: Unable to obtain cervical exam, placing epidural and then will reassess #Pain: Well controlled at this time. Continue current management #FWB: Cat 1 #ID:  None indicated #MOF: breast #MOC: none #Circ:  outpatient  Arlyce Harman 12/23/2016, 9:31 AM

## 2016-12-24 MED ORDER — PIPERACILLIN-TAZOBACTAM 3.375 G IVPB
3.3750 g | Freq: Three times a day (TID) | INTRAVENOUS | Status: DC
  Administered 2016-12-24 (×2): 3.375 g via INTRAVENOUS
  Filled 2016-12-24 (×3): qty 50

## 2016-12-24 NOTE — Progress Notes (Signed)
Pharmacy Antibiotic Note  Sandra Arnold is a 31 y.o. female admitted on 12/23/2016 for SROM and active labor. Pt is now s/p NSVD with postpartum fever. Pharmacy has been consulted for Zosyn dosing.  Plan: Zosyn 3.375 Gm IV every 8 hours  Height:  (154.9 cm) Weight: 164 lb (74.4 kg) IBW/kg (Calculated) : 47.8  Temp (24hrs), Avg:98.8 F (37.1 C), Min:97.3 F (36.3 C), Max:101.8 F (38.8 C)   Recent Labs Lab 12/23/16 0853 12/23/16 1816  WBC 8.6 16.8*    CrCl cannot be calculated (No order found.).    No Known Allergies  Antimicrobials this admission: Zosyn 3.375 Gm IV x 1 dose postpartum on 10/04 per MD.  Dose adjustments this admission: N/A  Microbiology results:   Thank you for allowing pharmacy to be a part of this patient's care.  Arelia Sneddon 12/24/2016 12:25 AM

## 2016-12-24 NOTE — Progress Notes (Signed)
Post Partum Day 1 Subjective: no complaints, up ad lib, voiding, tolerating PO and + flatus  Objective: Blood pressure 96/67, pulse 75, temperature 98.4 F (36.9 C), resp. rate 18, height  (1.549 m), weight 164 lb (74.4 kg), last menstrual period 03/24/2016, SpO2 97 %, unknown if currently breastfeeding.  Physical Exam:  General: alert, cooperative and no distress Lochia: appropriate Uterine Fundus: firm Incision: n/a DVT Evaluation: No evidence of DVT seen on physical exam.   Recent Labs  12/23/16 0853 12/23/16 1816  HGB 14.0 13.8  HCT 41.4 40.5    Assessment/Plan: Plan for discharge tomorrow, Breastfeeding and Contraception none   LOS: 1 day   Arlyce Harman 12/24/2016, 9:25 AM

## 2016-12-24 NOTE — Anesthesia Postprocedure Evaluation (Signed)
Anesthesia Post Note  Patient: Sandra Arnold  Procedure(s) Performed: AN AD HOC LABOR EPIDURAL     Patient location during evaluation: Mother Baby Anesthesia Type: Epidural Level of consciousness: awake and alert, oriented and patient cooperative Pain management: satisfactory to patient Vital Signs Assessment: post-procedure vital signs reviewed and stable Respiratory status: spontaneous breathing Cardiovascular status: stable Postop Assessment: no headache, epidural receding, patient able to bend at knees and no signs of nausea or vomiting Anesthetic complications: no Comments: Pain score 5.  Pt satisfied with pain control.    Last Vitals:  Vitals:   12/23/16 2100 12/24/16 0144  BP: 97/62 (!) 100/58  Pulse: (!) 112 97  Resp: 18   Temp: 37.3 C 37.2 C  SpO2: 97%     Last Pain:  Vitals:   12/24/16 0430  TempSrc:   PainSc: Asleep   Pain Goal:                 [facility]

## 2016-12-25 MED ORDER — IBUPROFEN 600 MG PO TABS: 600.0000 mg | ORAL_TABLET | Freq: Four times a day (QID) | ORAL | 0 refills | Status: AC | PRN

## 2016-12-25 NOTE — Lactation Note (Signed)
This note was copied from a baby's chart. Lactation Consultation Note  Patient Name: Sandra Arnold [id number] Date: 12/25/2016 Reason for consult: Initial assessment Infant is 19 hours old & seen by Lactation for Initial Assesment. Baby was sleeping when LC entered. Mom reports BF is going well. Mom reports she BF for ~7m with her first child and 25m with her second child and plans to BF at least 59m with this child. Mom reports she has WIC and will not be returning to work.  Provided mom with BF booklet, BF resources, and feeding log; mom made aware of O/P services, breastfeeding support groups, community resources, and our phone # for post-discharge questions. Mom encouraged to feed baby 8-12 times/24 hours and with feeding cues. Mom does not have a pump. Issued mom a manual Harmony pump- showed her how to use & clean it. Encouraged mom to only use it occassionally.  Showed mom milk storage guidelines in Baby book. Discussed engorgement prevention & care. Mom reports no questions. Encouraged mom to ask questions as needed.    Maternal Data Does the patient have breastfeeding experience prior to this delivery?: Yes  Feeding Feeding Type: Breast Fed Length of feed: 20 min  LATCH Score                   Interventions Interventions: Breast feeding basics reviewed;Hand pump  Lactation Tools Discussed/Used WIC Program: Yes Pump Review: Setup, frequency, and cleaning;Milk Storage   Consult Status Consult Status: Follow-up Date: 12/26/16 Follow-up type: In-patient    Oneal Grout 12/25/2016, 2:49 PM

## 2016-12-25 NOTE — Discharge Summary (Signed)
OB Discharge Summary     Patient Name: Sandra Arnold DOB: 02-Apr-1985 MRN: [id number]  Date of admission: 12/23/2016 Delivering MD: Rolm Bookbinder   Date of discharge: 12/25/2016  Admitting diagnosis: 38WKS, LABOR,WATER BROKE Intrauterine pregnancy: [redacted]w[redacted]d     Secondary diagnosis:  Active Problems:   Indication for care in labor or delivery   Normal labor  Additional problems: thyroid dx     Discharge diagnosis: Term Pregnancy Delivered                                                                                                Post partum procedures:none  Augmentation: none  Complications: Intrauterine Inflammation or infection (Chorioamniotis)- rec'd 24h of Zosyn for a PP fever  Hospital course:  Onset of Labor With Vaginal Delivery     31 y.o. yo G3P3003 at [redacted]w[redacted]d was admitted in Active Labor on 12/23/2016. Patient had an uncomplicated labor course as follows:  Membrane Rupture Time/Date: 2:00 AM ,12/23/2016   Intrapartum Procedures: Episiotomy: None [1]                                         Lacerations:  None [1]  Patient had a delivery of a Viable infant. 12/23/2016  Information for the patient's newborn:  Lonita, Debes [[id number]]  Delivery Method: Vaginal, Spontaneous Delivery (Filed from Delivery Summary)    Pateint had an uncomplicated postpartum course.  She is ambulating, tolerating a regular diet, passing flatus, and urinating well. Patient is discharged home in stable condition on 12/25/16.   Physical exam  Vitals:   12/24/16 0144 12/24/16 0821 12/24/16 1854 12/25/16 0600  BP: (!) 100/58 96/67 111/69 98/63  Pulse: 97 75 89 73  Resp:  Temp: 99 F (37.2 C) 98.4 F (36.9 C) 97.7 F (36.5 C) 98.2 F (36.8 C)  TempSrc:  Oral Oral Oral  SpO2:      Weight:      Height:       General: alert and cooperative Lochia: appropriate Uterine Fundus: firm Incision: N/A DVT Evaluation: No evidence of DVT seen on physical exam. Labs: Lab Results   Component Value Date   WBC 16.8 (H) 12/23/2016   HGB 13.8 12/23/2016   HCT 40.5 12/23/2016   MCV 87.5 12/23/2016   PLT 129 (L) 12/23/2016   No flowsheet data found.  Discharge instruction: per After Visit Summary and "Baby and Me Booklet".  After visit meds:  Allergies as of 12/25/2016   No Known Allergies     Medication List    TAKE these medications   ibuprofen 600 MG tablet Commonly known as:  ADVIL,MOTRIN Take 1 tablet (600 mg total) by mouth every 6 (six) hours as needed.   PREPLUS 27-1 MG Tabs Take 1 tablet by mouth daily.       Diet: routine diet  Activity: Advance as tolerated. Pelvic rest for 6 weeks.   Outpatient follow up:4 weeks Follow up Appt:No future appointments. Follow up Visit:No Follow-up on file.  Postpartum contraception: None  Newborn Data: Live born female  Birth Weight: 6 lb 13.9 oz (3116 g) APGAR: 8, 9  Newborn Delivery   Birth date/time:  12/23/2016 17:39:00 Delivery type:  Vaginal, Spontaneous Delivery      Baby Feeding: Breast Disposition:home with mother   12/25/2016 Cam Hai, CNM 7:46 AM

## 2016-12-25 NOTE — Discharge Instructions (Signed)

## 2016-12-27 ENCOUNTER — Encounter: Payer: Medicaid Other | Admitting: Obstetrics & Gynecology

## 2017-01-31 ENCOUNTER — Ambulatory Visit: Payer: Medicaid Other | Admitting: Advanced Practice Midwife

## 2017-02-02 ENCOUNTER — Ambulatory Visit: Payer: Medicaid Other | Admitting: Medical
